# Patient Record
Sex: Male | Born: 1990 | Race: White | Hispanic: No | Marital: Married | Smoking: Current every day smoker
Health system: Southern US, Community
[De-identification: ages and names within clinical notes are randomized; demographics above are authoritative.]

## PROBLEM LIST (undated history)

## (undated) DIAGNOSIS — S0990XA Unspecified injury of head, initial encounter: Secondary | ICD-10-CM

## (undated) HISTORY — PX: FEMUR FRACTURE SURGERY: SHX633

---
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
## 2010-03-10 ENCOUNTER — Emergency Department (HOSPITAL_COMMUNITY): Admission: EM | Admit: 2010-03-10 | Discharge: 2010-03-10 | Payer: Self-pay | Admitting: Emergency Medicine

## 2012-07-06 ENCOUNTER — Encounter (HOSPITAL_COMMUNITY): Payer: Self-pay

## 2012-07-06 ENCOUNTER — Emergency Department (HOSPITAL_COMMUNITY)
Admission: EM | Admit: 2012-07-06 | Discharge: 2012-07-07 | Disposition: A | Discharge status: Home / Self Care | Payer: Self-pay | Attending: Emergency Medicine | Admitting: Emergency Medicine

## 2012-07-06 DIAGNOSIS — R45851 Suicidal ideations: Secondary | ICD-10-CM | POA: Insufficient documentation

## 2012-07-06 DIAGNOSIS — F1910 Other psychoactive substance abuse, uncomplicated: Secondary | ICD-10-CM | POA: Insufficient documentation

## 2012-07-06 LAB — CBC WITH DIFFERENTIAL/PLATELET
Basophils Absolute: 0 10*3/uL (ref 0.0–0.1)
Basophils Relative: 0 % (ref 0–1)
Eosinophils Absolute: 0 10*3/uL (ref 0.0–0.7)
Eosinophils Relative: 1 % (ref 0–5)
HCT: 43.5 % (ref 39.0–52.0)
MCHC: 36.1 g/dL — ABNORMAL HIGH (ref 30.0–36.0)
Monocytes Absolute: 0.9 10*3/uL (ref 0.1–1.0)
Monocytes Relative: 11 % (ref 3–12)
Neutro Abs: 5.4 10*3/uL (ref 1.7–7.7)
Platelets: 211 10*3/uL (ref 150–400)
RDW: 12.2 % (ref 11.5–15.5)
WBC: 8.7 10*3/uL (ref 4.0–10.5)

## 2012-07-06 LAB — BASIC METABOLIC PANEL
Calcium: 8.8 mg/dL (ref 8.4–10.5)
Creatinine, Ser: 0.96 mg/dL (ref 0.50–1.35)
GFR calc Af Amer: >90 mL/min (ref >90)

## 2012-07-06 LAB — ETHANOL: Alcohol, Ethyl (B): 124 mg/dL — ABNORMAL HIGH (ref 0–11)

## 2012-07-06 MED ORDER — ZOLPIDEM TARTRATE 5 MG PO TABS: 5.0000 mg | ORAL_TABLET | Freq: Every evening | ORAL | Status: DC | PRN

## 2012-07-06 MED ORDER — ONDANSETRON HCL 4 MG PO TABS: 4.0000 mg | ORAL_TABLET | Freq: Three times a day (TID) | ORAL | Status: DC | PRN

## 2012-07-06 MED ORDER — NICOTINE 21 MG/24HR TD PT24
21.0000 mg | MEDICATED_PATCH | Freq: Every day | TRANSDERMAL | Status: DC
  Administered 2012-07-06: 21 mg via TRANSDERMAL
  Filled 2012-07-06: qty 1

## 2012-07-06 MED ORDER — LORAZEPAM 1 MG PO TABS
1.0000 mg | ORAL_TABLET | Freq: Three times a day (TID) | ORAL | Status: DC | PRN
  Administered 2012-07-06: 1 mg via ORAL
  Filled 2012-07-06: qty 1

## 2012-07-06 MED ORDER — ACETAMINOPHEN 325 MG PO TABS: 650.0000 mg | ORAL_TABLET | ORAL | Status: DC | PRN

## 2012-07-06 MED ORDER — IBUPROFEN 600 MG PO TABS: 600.0000 mg | ORAL_TABLET | Freq: Three times a day (TID) | ORAL | Status: DC | PRN

## 2012-07-06 MED ORDER — ALUM & MAG HYDROXIDE-SIMETH 200-200-20 MG/5ML PO SUSP: 30.0000 mL | ORAL | Status: DC | PRN

## 2012-07-06 NOTE — ED Notes (Signed)
Pt sts hes had suicidial thoughts since he was 21 yrs old, his father passed this past July and hes been more depressed since then; has thoughts about shooting himself; has a plan to shoot himself for the last two weeks, sts he wants to die; law enforcement at bedside

## 2012-07-06 NOTE — ED Notes (Signed)
Pt on telephone cussing and requesting to go home, report called to psych ed wendy rn
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
## 2012-07-06 NOTE — ED Notes (Signed)
Pt. Has 2 belonging bags sitting behind [facility].
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
## 2012-07-06 NOTE — ED Notes (Signed)
Pt sitting up in bed talking with visitor.  Visitor, Field seismologist, cousin at bedside.  Pt denies SI/HI.  Pt wants to know when ACT Team will come and do assessment

## 2012-07-06 NOTE — ED Notes (Signed)
[phone] Charlotte, mother

## 2012-07-06 NOTE — ED Notes (Signed)
MD at bedside. 
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
## 2012-07-07 NOTE — ED Notes (Signed)
Pt is currently waiting for ride to come pick him up. He declined toothbrush and toothpaste.

## 2012-07-07 NOTE — ED Notes (Signed)
Pt's family arrived to pick the pt up, writer walked pt up to the discharge window and gave him back his two personal belongings bags.

## 2012-07-07 NOTE — ED Notes (Signed)
Pt reports he's ready to discharge and get some real food. He denies being a threat to himself or others. He's called for a ride. Discharge papers have been explained to pt and he indicated full understanding. Referrals were given as well.
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
## 2014-09-20 ENCOUNTER — Encounter (HOSPITAL_BASED_OUTPATIENT_CLINIC_OR_DEPARTMENT_OTHER): Payer: Self-pay | Admitting: Emergency Medicine

## 2014-09-20 ENCOUNTER — Emergency Department (HOSPITAL_BASED_OUTPATIENT_CLINIC_OR_DEPARTMENT_OTHER): Payer: Self-pay

## 2014-09-20 ENCOUNTER — Emergency Department (HOSPITAL_BASED_OUTPATIENT_CLINIC_OR_DEPARTMENT_OTHER)
Admission: EM | Admit: 2014-09-20 | Discharge: 2014-09-20 | Disposition: A | Discharge status: Home / Self Care | Payer: Self-pay | Attending: Emergency Medicine | Admitting: Emergency Medicine

## 2014-09-20 ENCOUNTER — Emergency Department (HOSPITAL_COMMUNITY): Admission: EM | Admit: 2014-09-20 | Discharge: 2014-09-20 | Discharge status: Against Medical Advice | Payer: Self-pay

## 2014-09-20 DIAGNOSIS — Z720 Tobacco use: Secondary | ICD-10-CM | POA: Insufficient documentation

## 2014-09-20 DIAGNOSIS — S29001A Unspecified injury of muscle and tendon of front wall of thorax, initial encounter: Secondary | ICD-10-CM | POA: Insufficient documentation

## 2014-09-20 DIAGNOSIS — R52 Pain, unspecified: Secondary | ICD-10-CM

## 2014-09-20 DIAGNOSIS — R079 Chest pain, unspecified: Secondary | ICD-10-CM

## 2014-09-20 DIAGNOSIS — Y998 Other external cause status: Secondary | ICD-10-CM | POA: Insufficient documentation

## 2014-09-20 DIAGNOSIS — Y9389 Activity, other specified: Secondary | ICD-10-CM | POA: Insufficient documentation

## 2014-09-20 DIAGNOSIS — S42022A Displaced fracture of shaft of left clavicle, initial encounter for closed fracture: Secondary | ICD-10-CM | POA: Insufficient documentation

## 2014-09-20 DIAGNOSIS — W228XXA Striking against or struck by other objects, initial encounter: Secondary | ICD-10-CM | POA: Insufficient documentation

## 2014-09-20 DIAGNOSIS — S42002A Fracture of unspecified part of left clavicle, initial encounter for closed fracture: Secondary | ICD-10-CM

## 2014-09-20 DIAGNOSIS — Y9289 Other specified places as the place of occurrence of the external cause: Secondary | ICD-10-CM | POA: Insufficient documentation

## 2014-09-20 MED ORDER — ONDANSETRON 4 MG PO TBDP
4.0000 mg | ORAL_TABLET | Freq: Once | ORAL | Status: AC
  Administered 2014-09-20: 4 mg via ORAL

## 2014-09-20 MED ORDER — NAPROXEN 500 MG PO TABS: 500.0000 mg | ORAL_TABLET | Freq: Two times a day (BID) | ORAL | Status: DC

## 2014-09-20 MED ORDER — HYDROMORPHONE HCL 1 MG/ML IJ SOLN
INTRAMUSCULAR | Status: AC
  Administered 2014-09-20: 2 mg
  Filled 2014-09-20: qty 2

## 2014-09-20 MED ORDER — ONDANSETRON 4 MG PO TBDP
ORAL_TABLET | ORAL | Status: AC
Start: 2014-09-20 — End: 2014-09-20
  Administered 2014-09-20: 4 mg via ORAL
  Filled 2014-09-20: qty 1

## 2014-09-20 MED ORDER — HYDROMORPHONE HCL 4 MG PO TABS: 4.0000 mg | ORAL_TABLET | Freq: Four times a day (QID) | ORAL | Status: DC | PRN

## 2014-09-20 MED ORDER — HYDROMORPHONE HCL 2 MG/ML IJ SOLN
2.0000 mg | Freq: Once | INTRAMUSCULAR | Status: DC
  Filled 2014-09-20: qty 1

## 2014-09-20 MED ORDER — PROMETHAZINE HCL 25 MG PO TABS: 25.0000 mg | ORAL_TABLET | Freq: Four times a day (QID) | ORAL | Status: DC | PRN

## 2014-09-20 NOTE — ED Notes (Signed)
[facility] at the facility to be seen.

## 2014-09-20 NOTE — ED Notes (Signed)
Pt did not answer when called for triage 

## 2014-09-20 NOTE — ED Notes (Signed)
Patient transported to X-ray 
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
## 2014-09-20 NOTE — Discharge Instructions (Signed)
Use the sling as needed for comfort. Should feel more comfortable with it on. Take the Naprosyn on a regular basis supplemented with the hydromorphone pain medicine as needed. If sports medicine on call for follow-up on Monday. Also given information on orthopedic surgical shoe list if needed.

## 2014-09-20 NOTE — ED Notes (Signed)
Call patient to triage no answer.

## 2014-09-22 ENCOUNTER — Ambulatory Visit (INDEPENDENT_AMBULATORY_CARE_PROVIDER_SITE_OTHER): Payer: Self-pay | Admitting: Family Medicine

## 2014-09-22 ENCOUNTER — Encounter: Payer: Self-pay | Admitting: Family Medicine

## 2014-09-22 VITALS — BP 129/88 | HR 80 | Ht 70.0 in | Wt 175.0 lb

## 2014-09-22 DIAGNOSIS — S42002A Fracture of unspecified part of left clavicle, initial encounter for closed fracture: Secondary | ICD-10-CM

## 2014-09-22 MED ORDER — HYDROMORPHONE HCL 4 MG PO TABS
4.0000 mg | ORAL_TABLET | Freq: Four times a day (QID) | ORAL | Status: DC | PRN
Start: 2014-09-22 — End: 2014-10-01

## 2014-09-22 MED ORDER — HYDROCODONE-ACETAMINOPHEN 5-325 MG PO TABS: 1.0000 | ORAL_TABLET | Freq: Four times a day (QID) | ORAL | Status: DC | PRN

## 2014-09-22 MED ORDER — ONDANSETRON HCL 4 MG PO TABS: 4.0000 mg | ORAL_TABLET | Freq: Three times a day (TID) | ORAL | Status: DC | PRN

## 2014-09-23 DIAGNOSIS — S42002A Fracture of unspecified part of left clavicle, initial encounter for closed fracture: Secondary | ICD-10-CM | POA: Insufficient documentation
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
## 2014-09-23 NOTE — Assessment & Plan Note (Addendum)
Left mid-clavicle fracture - significant overlap and shortening of 4.5cm.  Discussed surgery is typically required for these types of fractures to regain full motion and for the fracture fragments to be approximated, fixated and allow this to heal.  Unfortunately he does not have insurance however.  We gave him cone coverage paperwork, called to local orthopedic groups and patient will try to come up with money up front required for him to be evaluated.  Appointment was made for this afternoon with ortho but patient wanted to cancel this and discuss with his family, call us back.  Without surgery I do not think he will regain full motion, strength, and may deal with chronic pain as a result - patient made aware of these risks.  In meantime he will use sling regularly, ice, nsaids with dilaudid and anti-emetic.

## 2014-09-25 NOTE — Addendum Note (Signed)
Addended by: Kathi SimpersWISE, Makaio Mach F on: 09/25/2014 09:02 AM   Modules accepted: Orders

## 2014-09-30 ENCOUNTER — Other Ambulatory Visit (HOSPITAL_COMMUNITY): Payer: Self-pay | Admitting: Orthopaedic Surgery

## 2014-09-30 ENCOUNTER — Encounter (HOSPITAL_COMMUNITY): Payer: Self-pay | Admitting: *Deleted

## 2014-09-30 NOTE — Anesthesia Preprocedure Evaluation (Addendum)
Anesthesia Evaluation  Patient identified by MRN, date of birth, ID band Patient awake    Reviewed: Allergy & Precautions, NPO status , Patient's Chart, lab work & pertinent test results, reviewed documented beta blocker date and time   Airway Mallampati: I  TM Distance: >3 FB Neck ROM: Full    Dental  (+) Teeth Intact, Dental Advisory Given   Pulmonary Current Smoker,  breath sounds clear to auscultation        Cardiovascular negative cardio ROS  Rhythm:Regular Rate:Normal     Neuro/Psych negative neurological ROS     GI/Hepatic negative GI ROS, Neg liver ROS,   Endo/Other  negative endocrine ROS  Renal/GU      Musculoskeletal   Abdominal   Peds  Hematology negative hematology ROS (+)   Anesthesia Other Findings   Reproductive/Obstetrics                            Anesthesia Physical Anesthesia Plan  ASA: II  Anesthesia Plan: General   Post-op Pain Management:    Induction: Intravenous  Airway Management Planned: Oral ETT  Additional Equipment:   Intra-op Plan:   Post-operative Plan: Extubation in OR  Informed Consent: I have reviewed the patients History and Physical, chart, labs and discussed the procedure including the risks, benefits and alternatives for the proposed anesthesia with the patient or authorized representative who has indicated his/her understanding and acceptance.     Plan Discussed with:   Anesthesia Plan Comments: (Check am CBC)        Anesthesia Quick Evaluation
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
## 2014-10-01 ENCOUNTER — Encounter (HOSPITAL_COMMUNITY): Payer: Self-pay | Admitting: Surgery

## 2014-10-01 ENCOUNTER — Encounter (HOSPITAL_COMMUNITY)
Admission: RE | Disposition: A | Discharge status: Home / Self Care | Payer: Self-pay | Source: Ambulatory Visit | Attending: Orthopaedic Surgery

## 2014-10-01 ENCOUNTER — Ambulatory Visit (HOSPITAL_COMMUNITY): Payer: Medicaid Other

## 2014-10-01 ENCOUNTER — Ambulatory Visit (HOSPITAL_COMMUNITY)
Admission: RE | Admit: 2014-10-01 | Discharge: 2014-10-01 | Disposition: A | Discharge status: Home / Self Care | Payer: Medicaid Other | Source: Ambulatory Visit | Attending: Orthopaedic Surgery | Admitting: Orthopaedic Surgery

## 2014-10-01 ENCOUNTER — Ambulatory Visit (HOSPITAL_COMMUNITY): Payer: Medicaid Other | Admitting: Anesthesiology

## 2014-10-01 DIAGNOSIS — Y9289 Other specified places as the place of occurrence of the external cause: Secondary | ICD-10-CM | POA: Insufficient documentation

## 2014-10-01 DIAGNOSIS — S42002A Fracture of unspecified part of left clavicle, initial encounter for closed fracture: Secondary | ICD-10-CM | POA: Diagnosis present

## 2014-10-01 DIAGNOSIS — Z882 Allergy status to sulfonamides status: Secondary | ICD-10-CM | POA: Diagnosis not present

## 2014-10-01 DIAGNOSIS — Y9389 Activity, other specified: Secondary | ICD-10-CM | POA: Insufficient documentation

## 2014-10-01 DIAGNOSIS — Z888 Allergy status to other drugs, medicaments and biological substances status: Secondary | ICD-10-CM | POA: Diagnosis not present

## 2014-10-01 DIAGNOSIS — Z419 Encounter for procedure for purposes other than remedying health state, unspecified: Secondary | ICD-10-CM

## 2014-10-01 DIAGNOSIS — Y998 Other external cause status: Secondary | ICD-10-CM | POA: Insufficient documentation

## 2014-10-01 DIAGNOSIS — X58XXXA Exposure to other specified factors, initial encounter: Secondary | ICD-10-CM | POA: Insufficient documentation

## 2014-10-01 DIAGNOSIS — F17210 Nicotine dependence, cigarettes, uncomplicated: Secondary | ICD-10-CM | POA: Insufficient documentation

## 2014-10-01 DIAGNOSIS — Z885 Allergy status to narcotic agent status: Secondary | ICD-10-CM | POA: Diagnosis not present

## 2014-10-01 HISTORY — DX: Unspecified injury of head, initial encounter: S09.90XA

## 2014-10-01 HISTORY — PX: ORIF CLAVICULAR FRACTURE: SHX5055

## 2014-10-01 LAB — COMPREHENSIVE METABOLIC PANEL
ALT: 74 U/L — ABNORMAL HIGH (ref 0–53)
AST: 64 U/L — ABNORMAL HIGH (ref 0–37)
Albumin: 4.3 g/dL (ref 3.5–5.2)
Alkaline Phosphatase: 60 U/L (ref 39–117)
Anion gap: 8 (ref 5–15)
BILIRUBIN TOTAL: 0.4 mg/dL (ref 0.3–1.2)
BUN: 10 mg/dL (ref 6–23)
CO2: 29 mmol/L (ref 19–32)
Calcium: 9.6 mg/dL (ref 8.4–10.5)
Chloride: 102 mmol/L (ref 96–112)
Creatinine, Ser: 1.1 mg/dL (ref 0.50–1.35)
GFR calc Af Amer: >90 mL/min (ref >90)
Glucose, Bld: 99 mg/dL (ref 70–99)
Potassium: 4.2 mmol/L (ref 3.5–5.1)
Sodium: 139 mmol/L (ref 135–145)
Total Protein: 7.5 g/dL (ref 6.0–8.3)

## 2014-10-01 LAB — CBC
HCT: 47.7 % (ref 39.0–52.0)
Hemoglobin: 16.7 g/dL (ref 13.0–17.0)
MCH: 29.9 pg (ref 26.0–34.0)
MCHC: 35 g/dL (ref 30.0–36.0)
MCV: 85.5 fL (ref 78.0–100.0)
Platelets: 220 10*3/uL (ref 150–400)
RBC: 5.58 MIL/uL (ref 4.22–5.81)
RDW: 13 % (ref 11.5–15.5)
WBC: 11 10*3/uL — ABNORMAL HIGH (ref 4.0–10.5)

## 2014-10-01 SURGERY — OPEN REDUCTION INTERNAL FIXATION (ORIF) CLAVICULAR FRACTURE
Anesthesia: General | Laterality: Left

## 2014-10-01 MED ORDER — FENTANYL CITRATE 0.05 MG/ML IJ SOLN
INTRAMUSCULAR | Status: AC
  Filled 2014-10-01: qty 5

## 2014-10-01 MED ORDER — CEFAZOLIN SODIUM-DEXTROSE 2-3 GM-% IV SOLR
2.0000 g | INTRAVENOUS | Status: AC
  Administered 2014-10-01: 2 g via INTRAVENOUS
  Filled 2014-10-01: qty 50

## 2014-10-01 MED ORDER — LIDOCAINE HCL (CARDIAC) 20 MG/ML IV SOLN
INTRAVENOUS | Status: AC
  Filled 2014-10-01: qty 15

## 2014-10-01 MED ORDER — GLYCOPYRROLATE 0.2 MG/ML IJ SOLN
INTRAMUSCULAR | Status: DC | PRN
  Administered 2014-10-01: 0.6 mg via INTRAVENOUS

## 2014-10-01 MED ORDER — HYDROMORPHONE HCL 1 MG/ML IJ SOLN
INTRAMUSCULAR | Status: AC
  Administered 2014-10-01: 0.5 mg via INTRAVENOUS
  Filled 2014-10-01: qty 1

## 2014-10-01 MED ORDER — BUPIVACAINE HCL (PF) 0.5 % IJ SOLN
INTRAMUSCULAR | Status: AC
  Filled 2014-10-01: qty 10

## 2014-10-01 MED ORDER — BUPIVACAINE HCL (PF) 0.5 % IJ SOLN
INTRAMUSCULAR | Status: DC | PRN
  Administered 2014-10-01: 10 mL

## 2014-10-01 MED ORDER — ONDANSETRON HCL 4 MG/2ML IJ SOLN
INTRAMUSCULAR | Status: DC | PRN
  Administered 2014-10-01: 4 mg via INTRAVENOUS

## 2014-10-01 MED ORDER — FENTANYL CITRATE 0.05 MG/ML IJ SOLN
INTRAMUSCULAR | Status: DC | PRN
  Administered 2014-10-01: 150 ug via INTRAVENOUS
  Administered 2014-10-01: 100 ug via INTRAVENOUS
  Administered 2014-10-01: 50 ug via INTRAVENOUS
  Administered 2014-10-01 (×2): 100 ug via INTRAVENOUS

## 2014-10-01 MED ORDER — FENTANYL CITRATE 0.05 MG/ML IJ SOLN: 25.0000 ug | INTRAMUSCULAR | Status: DC | PRN

## 2014-10-01 MED ORDER — PROMETHAZINE HCL 25 MG/ML IJ SOLN: 6.2500 mg | INTRAMUSCULAR | Status: DC | PRN

## 2014-10-01 MED ORDER — METHOCARBAMOL 500 MG PO TABS: 500.0000 mg | ORAL_TABLET | Freq: Four times a day (QID) | ORAL | Status: DC | PRN

## 2014-10-01 MED ORDER — DEXAMETHASONE SODIUM PHOSPHATE 10 MG/ML IJ SOLN
INTRAMUSCULAR | Status: DC | PRN
  Administered 2014-10-01: 10 mg via INTRAVENOUS

## 2014-10-01 MED ORDER — HYDROMORPHONE HCL 1 MG/ML IJ SOLN
0.5000 mg | INTRAMUSCULAR | Status: DC | PRN
  Administered 2014-10-01 (×2): 0.5 mg via INTRAVENOUS

## 2014-10-01 MED ORDER — BUPIVACAINE LIPOSOME 1.3 % IJ SUSP
8.0000 mL | Freq: Once | INTRAMUSCULAR | Status: AC
  Administered 2014-10-01: 20 mL
  Filled 2014-10-01: qty 20

## 2014-10-01 MED ORDER — DEXAMETHASONE SODIUM PHOSPHATE 10 MG/ML IJ SOLN
INTRAMUSCULAR | Status: AC
  Filled 2014-10-01: qty 1

## 2014-10-01 MED ORDER — MIDAZOLAM HCL 2 MG/2ML IJ SOLN
INTRAMUSCULAR | Status: AC
  Filled 2014-10-01: qty 2

## 2014-10-01 MED ORDER — NEOSTIGMINE METHYLSULFATE 10 MG/10ML IV SOLN
INTRAVENOUS | Status: DC | PRN
  Administered 2014-10-01: 5 mg via INTRAVENOUS

## 2014-10-01 MED ORDER — LIDOCAINE HCL (CARDIAC) 20 MG/ML IV SOLN
INTRAVENOUS | Status: DC | PRN
  Administered 2014-10-01: 50 mg via INTRAVENOUS
  Administered 2014-10-01: 100 mg via INTRAVENOUS

## 2014-10-01 MED ORDER — LIDOCAINE HCL (CARDIAC) 20 MG/ML IV SOLN
INTRAVENOUS | Status: AC
  Filled 2014-10-01: qty 5

## 2014-10-01 MED ORDER — PROPOFOL 10 MG/ML IV BOLUS
INTRAVENOUS | Status: AC
  Filled 2014-10-01: qty 20

## 2014-10-01 MED ORDER — FENTANYL CITRATE 0.05 MG/ML IJ SOLN
100.0000 ug | Freq: Once | INTRAMUSCULAR | Status: AC
  Administered 2014-10-01: 100 ug via INTRAVENOUS

## 2014-10-01 MED ORDER — HYDROMORPHONE HCL 2 MG PO TABS: 2.0000 mg | ORAL_TABLET | ORAL | Status: DC | PRN

## 2014-10-01 MED ORDER — ROCURONIUM BROMIDE 100 MG/10ML IV SOLN
INTRAVENOUS | Status: DC | PRN
  Administered 2014-10-01: 40 mg via INTRAVENOUS
  Administered 2014-10-01: 10 mg via INTRAVENOUS

## 2014-10-01 MED ORDER — MEPERIDINE HCL 25 MG/ML IJ SOLN: 6.2500 mg | INTRAMUSCULAR | Status: DC | PRN

## 2014-10-01 MED ORDER — FENTANYL CITRATE 0.05 MG/ML IJ SOLN
INTRAMUSCULAR | Status: AC
  Administered 2014-10-01: 100 ug via INTRAVENOUS
  Filled 2014-10-01: qty 2

## 2014-10-01 MED ORDER — PROPOFOL 10 MG/ML IV BOLUS
INTRAVENOUS | Status: DC | PRN
  Administered 2014-10-01: 200 mg via INTRAVENOUS

## 2014-10-01 MED ORDER — LACTATED RINGERS IV SOLN
INTRAVENOUS | Status: DC
  Administered 2014-10-01 (×2): via INTRAVENOUS

## 2014-10-01 MED ORDER — 0.9 % SODIUM CHLORIDE (POUR BTL) OPTIME
TOPICAL | Status: DC | PRN
  Administered 2014-10-01: 1000 mL

## 2014-10-01 MED ORDER — MIDAZOLAM HCL 5 MG/5ML IJ SOLN
INTRAMUSCULAR | Status: DC | PRN
  Administered 2014-10-01: 2 mg via INTRAVENOUS

## 2014-10-01 SURGICAL SUPPLY — 62 items
APL SKNCLS STERI-STRIP NONHPOA (GAUZE/BANDAGES/DRESSINGS) ×1
BENZOIN TINCTURE PRP APPL 2/3 (GAUZE/BANDAGES/DRESSINGS) ×2 IMPLANT
BIT DRILL 2.5X2.75 QC CALB (BIT) ×2 IMPLANT
BIT DRILL CALIBRATED 2.7 (BIT) ×1 IMPLANT
BIT DRILL CALIBRATED 2.7MM (BIT) ×1
CHLORAPREP W/TINT 26ML (MISCELLANEOUS) ×2 IMPLANT
CLOSURE WOUND 1/2 X4 (GAUZE/BANDAGES/DRESSINGS) ×1
COVER SURGICAL LIGHT HANDLE (MISCELLANEOUS) ×3 IMPLANT
DRAPE C-ARM 42X72 X-RAY (DRAPES) ×3 IMPLANT
DRAPE IMP U-DRAPE 54X76 (DRAPES) ×3 IMPLANT
DRAPE INCISE 23X17 IOBAN STRL (DRAPES) ×2
DRAPE INCISE 23X17 STRL (DRAPES) IMPLANT
DRAPE INCISE IOBAN 23X17 STRL (DRAPES) ×1 IMPLANT
DRAPE U-SHAPE 47X51 STRL (DRAPES) ×3 IMPLANT
DRSG EMULSION OIL 3X3 NADH (GAUZE/BANDAGES/DRESSINGS) ×3 IMPLANT
ELECT REM PT RETURN 9FT ADLT (ELECTROSURGICAL) ×3
ELECTRODE REM PT RTRN 9FT ADLT (ELECTROSURGICAL) ×1 IMPLANT
GAUZE SPONGE 4X4 12PLY STRL (GAUZE/BANDAGES/DRESSINGS) ×3 IMPLANT
GLOVE BIO SURGEON STRL SZ7 (GLOVE) ×2 IMPLANT
GLOVE BIOGEL PI IND STRL 7.0 (GLOVE) IMPLANT
GLOVE BIOGEL PI IND STRL 8 (GLOVE) ×1 IMPLANT
GLOVE BIOGEL PI INDICATOR 7.0 (GLOVE) ×4
GLOVE BIOGEL PI INDICATOR 8 (GLOVE) ×4
GLOVE BIOGEL PI ORTHO PRO 7.5 (GLOVE) ×2
GLOVE ORTHO TXT STRL SZ7.5 (GLOVE) ×3 IMPLANT
GLOVE PI ORTHO PRO STRL 7.5 (GLOVE) IMPLANT
GOWN STRL REUS W/ TWL LRG LVL3 (GOWN DISPOSABLE) ×1 IMPLANT
GOWN STRL REUS W/TWL 2XL LVL3 (GOWN DISPOSABLE) ×2 IMPLANT
GOWN STRL REUS W/TWL LRG LVL3 (GOWN DISPOSABLE) ×3
KIT BASIN OR (CUSTOM PROCEDURE TRAY) ×3 IMPLANT
KIT ROOM TURNOVER OR (KITS) ×3 IMPLANT
MANIFOLD NEPTUNE II (INSTRUMENTS) ×3 IMPLANT
NDL HYPO 25GX1X1/2 BEV (NEEDLE) IMPLANT
NEEDLE HYPO 25GX1X1/2 BEV (NEEDLE) ×3 IMPLANT
NS IRRIG 1000ML POUR BTL (IV SOLUTION) ×3 IMPLANT
PACK SHOULDER (CUSTOM PROCEDURE TRAY) ×3 IMPLANT
PACK UNIVERSAL I (CUSTOM PROCEDURE TRAY) ×3 IMPLANT
PAD ARMBOARD 7.5X6 YLW CONV (MISCELLANEOUS) ×6 IMPLANT
PLATE FIBULAR COMP LOCK 10H (Plate) ×2 IMPLANT
SCREW CORT T15 24X3.5XST LCK (Screw) IMPLANT
SCREW CORTICAL 3.5X24MM (Screw) ×3 IMPLANT
SCREW CORTICAL LOW PROF 3.5X20 (Screw) ×4 IMPLANT
SCREW LOCK 3.5X22 DIST TIB (Screw) ×2 IMPLANT
SCREW LOCK CORT STAR 3.5X12 (Screw) ×2 IMPLANT
SCREW LOCK CORT STAR 3.5X16 (Screw) ×4 IMPLANT
SCREW LOW PROFILE 18MMX3.5MM (Screw) ×2 IMPLANT
SCREW NON LOCKING LP 3.5 16MM (Screw) ×4 IMPLANT
SLING ARM IMMOBILIZER LRG (SOFTGOODS) ×2 IMPLANT
SPONGE LAP 18X18 X RAY DECT (DISPOSABLE) ×6 IMPLANT
STRIP CLOSURE SKIN 1/2X4 (GAUZE/BANDAGES/DRESSINGS) ×2 IMPLANT
SUCTION FRAZIER TIP 10 FR DISP (SUCTIONS) ×3 IMPLANT
SUT PROLENE 3 0 PS 1 (SUTURE) ×3 IMPLANT
SUT VIC AB 2-0 CT1 27 (SUTURE) ×3
SUT VIC AB 2-0 CT1 TAPERPNT 27 (SUTURE) ×1 IMPLANT
SUT VIC AB 3-0 SH 27 (SUTURE) ×3
SUT VIC AB 3-0 SH 27X BRD (SUTURE) IMPLANT
SUT VICRYL 0 CT 1 36IN (SUTURE) ×3 IMPLANT
SYR CONTROL 10ML LL (SYRINGE) ×2 IMPLANT
TAPE CLOTH SURG 6X10 WHT LF (GAUZE/BANDAGES/DRESSINGS) ×2 IMPLANT
WATER STERILE IRR 1000ML POUR (IV SOLUTION) ×3 IMPLANT
WIRE K 1.6MM 144256 (MISCELLANEOUS) ×4 IMPLANT
YANKAUER SUCT BULB TIP NO VENT (SUCTIONS) ×3 IMPLANT

## 2014-10-01 NOTE — Discharge Instructions (Signed)

## 2014-10-01 NOTE — Progress Notes (Signed)
Pt stated feeling much better.

## 2014-10-01 NOTE — Interval H&P Note (Signed)
History and Physical Interval Note:  10/01/2014 2:31 PM  Jason Sims  has presented today for surgery, with the diagnosis of Left Comminuted Clavicle Fracture  The various methods of treatment have been discussed with the patient and family. After consideration of risks, benefits and other options for treatment, the patient has consented to  Procedure(s): OPEN REDUCTION INTERNAL FIXATION (ORIF) CLAVICULAR FRACTURE (Left) as a surgical intervention .  The patient's history has been reviewed, patient examined, no change in status, stable for surgery.  I have reviewed the patient's chart and labs.  Questions were answered to the patient's satisfaction.     Chan Rosasco C

## 2014-10-01 NOTE — Transfer of Care (Signed)
Immediate Anesthesia Transfer of Care Note  Patient: Jason Sims  Procedure(s) Performed: Procedure(s): OPEN REDUCTION INTERNAL FIXATION (ORIF) CLAVICULAR FRACTURE (Left)  Patient Location: PACU  Anesthesia Type:General  Level of Consciousness: awake, oriented, sedated, patient cooperative and responds to stimulation  Airway & Oxygen Therapy: Patient Spontanous Breathing and Patient connected to nasal cannula oxygen  Post-op Assessment: Report given to PACU RN, Post -op Vital signs reviewed and stable, Patient moving all extremities and Patient moving all extremities X 4  Post vital signs: Reviewed and stable  Complications: No apparent anesthesia complications

## 2014-10-01 NOTE — Anesthesia Procedure Notes (Addendum)
Procedure Name: Intubation Date/Time: 10/01/2014 3:50 PM Performed by: Elon AlasLEE, HEATHER BROWN Pre-anesthesia Checklist: Patient identified, Timeout performed, Emergency Drugs available, Suction available and Patient being monitored Patient Re-evaluated:Patient Re-evaluated prior to inductionOxygen Delivery Method: Circle system utilized Preoxygenation: Pre-oxygenation with 100% oxygen Intubation Type: IV induction Ventilation: Mask ventilation without difficulty Laryngoscope Size: Mac and 4 Grade View: Grade I Tube type: Oral Tube size: 7.5 mm Number of attempts: 1 Airway Equipment and Method: Stylet Placement Confirmation: CO2 detector,  positive ETCO2,  breath sounds checked- equal and bilateral and ETT inserted through vocal cords under direct vision Secured at: 23 cm Tube secured with: Tape Dental Injury: Teeth and Oropharynx as per pre-operative assessment

## 2014-10-01 NOTE — Brief Op Note (Signed)
10/01/2014  5:19 PM  PATIENT:  Jason Sims  24 y.o. male  PRE-OPERATIVE DIAGNOSIS:  Left Comminuted Clavicle Fracture  POST-OPERATIVE DIAGNOSIS:  Left Comminuted Clavicle Fracture  PROCEDURE:  Procedure(s): OPEN REDUCTION INTERNAL FIXATION (ORIF) CLAVICULAR FRACTURE (Left)  SURGEON:  Surgeon(s) and Role:    * Eldred MangesMark C Yates, MD - Primary  PHYSICIAN ASSISTANT: Zonia Kiefjames owens pa-c    ANESTHESIA:   general  EBL:  Total I/O In: 1000 [I.V.:1000] Out: -   BLOOD ADMINISTERED:none  DRAINS: none   SPECIMEN:  No Specimen  DISPOSITION OF SPECIMEN:  N/A  COUNTS:  YES  TOURNIQUET:  * No tourniquets in log *

## 2014-10-02 ENCOUNTER — Encounter (HOSPITAL_COMMUNITY): Payer: Self-pay | Admitting: Orthopaedic Surgery

## 2014-10-02 NOTE — Anesthesia Postprocedure Evaluation (Signed)
  Anesthesia Post-op Note  Patient: Jason Sims  Procedure(s) Performed: Procedure(s): OPEN REDUCTION INTERNAL FIXATION (ORIF) CLAVICULAR FRACTURE (Left)  Patient Location: PACU  Anesthesia Type:General  Level of Consciousness: awake and alert   Airway and Oxygen Therapy: Patient Spontanous Breathing  Post-op Pain: none  Post-op Assessment: Post-op Vital signs reviewed  Post-op Vital Signs: Reviewed  Last Vitals:  Filed Vitals:   10/01/14 1817  BP: 126/56  Pulse: 70  Temp: 36.7 C  Resp: 17    Complications: No apparent anesthesia complications

## 2014-10-02 NOTE — Op Note (Signed)
NAMValda Sims:  [name], [name]               ACCOUNT NO.:  [id number]  MEDICAL RECORD NO.:  [id number]  LOCATION:  [facility]                         FACILITY:  [facility]  PHYSICIAN:  Mark C. Ophelia CharterYates, M.D.    DATE OF BIRTH:  [birth year]  DATE OF PROCEDURE:  10/01/2014 DATE OF DISCHARGE:  10/01/2014                              OPERATIVE REPORT   PREOPERATIVE DIAGNOSIS:  Left comminuted displaced clavicle fracture.  POSTOPERATIVE DIAGNOSIS:  Left comminuted displaced clavicle fracture.  PROCEDURE:  Open reduction and internal fixation, left clavicle.  SURGEON:  Mark C. Ophelia CharterYates, M.D.  Assistant:   Zonia KiefJames Owens PA-C medically necessary and present for the entire procedure.  ANESTHESIA:  General plus Exparel.  ESTIMATED BLOOD LOSS:  Minimal.  IMPLANTS:  Biomet composite titanium plate with 9 screws.  DESCRIPTION OF PROCEDURE:  After induction of general anesthesia, the patient was placed in a beach-chair position.  Ancef was given prophylactically.  Time-out procedure completed after prepping and draping.  Sterile skin marker was used.  Clear Steri-Drape was applied due to the patient's problems with Betadine and rash.  Incision was made following the clavicle.  Clavicle was identified proximal or medial and also lateral, then followed to the midline where there was comminution of the fracture, butterfly fragments.  Butterfly portions were repaired to the distal, fragment held with a K-wire and then medial and large lateral fragments were reduced, held with self retaining clamp overlying the Biomet composite plate which have been custom bent distally with a sharp end to fit the anterior distal surface.  Plate was placed on the anterior aspect of the clavicle, custom bent; combination of locking and nonlocking screws were reused to suck the plate down securely to the bone like the butterfly fragments and compress to plate.  Locking screws were placed intermittently once plate was down securely with the  bone were indicated.  The entire area was irrigated.  Two Fluorospot pictures were taken confirming reduction in good position and good screw length. Exparel was placed at the beginning of the case, it was diluted down with 10 mL of Marcaine, total of 30 mL and injected in the skin, subcutaneous tissue around the muscle.  Subcutaneous tissue after closure of the muscle with 0 Vicryl, 2-0 Vicryl and subcutaneous tissue, 3-0 Vicryl subcuticular closure.  Tincture of benzoin, Steri-Strips, 4x4's tape, and shoulder immobilizer; office followup in one week.  The patient will be discharge home and was not admitted.     Mark C. Ophelia CharterYates, M.D.     [facility]/MEDQ  D:  10/01/2014  T:  10/02/2014  Job:  [id number]

## 2015-10-09 ENCOUNTER — Encounter: Payer: Self-pay | Admitting: Internal Medicine

## 2015-10-09 ENCOUNTER — Other Ambulatory Visit: Payer: Self-pay | Admitting: Internal Medicine

## 2015-10-09 ENCOUNTER — Ambulatory Visit (INDEPENDENT_AMBULATORY_CARE_PROVIDER_SITE_OTHER): Payer: Managed Care, Other (non HMO) | Admitting: Internal Medicine

## 2015-10-09 VITALS — BP 124/80 | HR 68 | Temp 98.2°F | Resp 18 | Ht 70.5 in | Wt 190.0 lb

## 2015-10-09 DIAGNOSIS — L259 Unspecified contact dermatitis, unspecified cause: Secondary | ICD-10-CM | POA: Diagnosis not present

## 2015-10-09 MED ORDER — CLOTRIMAZOLE-BETAMETHASONE 1-0.05 % EX CREA: TOPICAL_CREAM | CUTANEOUS | Status: AC

## 2015-10-09 NOTE — Progress Notes (Signed)
   Subjective:    Patient ID: Jason Sims, male    DOB: 20-Aug-1991, 25 y.o.   MRN: [id number]  Rash Pertinent negatives include no cough, diarrhea, fatigue, fever or vomiting.  Patient presents to the office for evaluation of a rash on the left ring finger which has been there for several months.  He reports that it has been getting a little bit better with some hydrocortisone but it always seems to stay there.  Rash is not itchy when he uses cortisone.  He does report that it does get dry and starts bothering him.  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  He has not tried anything else.  Heat does tend to bother him.      Review of Systems  Constitutional: Negative for fever, chills and fatigue.  Respiratory: Negative for cough, chest tightness and wheezing.   Cardiovascular: Negative for chest pain and palpitations.  Gastrointestinal: Negative for nausea, vomiting, abdominal pain, diarrhea and constipation.  Genitourinary: Negative for dysuria, urgency, frequency and hematuria.  Musculoskeletal: Negative for myalgias, arthralgias, gait problem and neck stiffness.  Skin: Positive for rash. Negative for color change, pallor and wound.  Neurological: Negative for dizziness, speech difficulty and light-headedness.       Objective:   Physical Exam  Constitutional: He is oriented to person, place, and time. He appears well-developed and well-nourished. No distress.  HENT:  Head: Normocephalic.  Mouth/Throat: Oropharynx is clear and moist. No oropharyngeal exudate.  Eyes: Conjunctivae are normal. No scleral icterus.  Neck: Normal range of motion. Neck supple. No JVD present. No thyromegaly present.  Cardiovascular: Normal rate, regular rhythm, normal heart sounds and intact distal pulses.  Exam reveals no gallop and no friction rub.   No murmur heard. Pulmonary/Chest: Effort normal and breath sounds normal. No respiratory distress. He has no wheezes. He has no rales. He exhibits  no tenderness.  Musculoskeletal: Normal range of motion.  Lymphadenopathy:    He has no cervical adenopathy.  Neurological: He is alert and oriented to person, place, and time.  Skin: Skin is warm and dry. He is not diaphoretic.  Psychiatric: He has a normal mood and affect. His behavior is normal. Judgment and thought content normal.  Nursing note and vitals reviewed.   Filed Vitals:   10/09/15 0841  BP: 124/80  Pulse: 68  Temp: 98.2 F (36.8 C)  Resp: 18         Assessment & Plan:    1. Contact dermatitis -recheck in 1 month for full new patient CPE where we will go over all the history and talk about other issues which he would like to address today but given time constraints is no appropriate.   - clotrimazole-betamethasone (LOTRISONE) cream; Apply to affected area 2 times daily  Dispense: 15 g; Refill: 1

## 2015-10-09 NOTE — Patient Instructions (Signed)
Contact Dermatitis Dermatitis is redness, soreness, and swelling (inflammation) of the skin. Contact dermatitis is a reaction to certain substances that touch the skin. There are two types of contact dermatitis:   Irritant contact dermatitis. This type is caused by something that irritates your skin, such as dry hands from washing them too much. This type does not require previous exposure to the substance for a reaction to occur. This type is more common.  Allergic contact dermatitis. This type is caused by a substance that you are allergic to, such as a nickel allergy or poison ivy. This type only occurs if you have been exposed to the substance (allergen) before. Upon a repeat exposure, your body reacts to the substance. This type is less common. CAUSES  Many different substances can cause contact dermatitis. Irritant contact dermatitis is most commonly caused by exposure to:   Makeup.   Soaps.   Detergents.   Bleaches.   Acids.   Metal salts, such as nickel.  Allergic contact dermatitis is most commonly caused by exposure to:   Poisonous plants.   Chemicals.   Jewelry.   Latex.   Medicines.   Preservatives in products, such as clothing.  RISK FACTORS This condition is more likely to develop in:   People who have jobs that expose them to irritants or allergens.  People who have certain medical conditions, such as asthma or eczema.  SYMPTOMS  Symptoms of this condition may occur anywhere on your body where the irritant has touched you or is touched by you. Symptoms include:  Dryness or flaking.   Redness.   Cracks.   Itching.   Pain or a burning feeling.   Blisters.  Drainage of small amounts of blood or clear fluid from skin cracks. With allergic contact dermatitis, there may also be swelling in areas such as the eyelids, mouth, or genitals.  DIAGNOSIS  This condition is diagnosed with a medical history and physical exam. A patch skin test  may be performed to help determine the cause. If the condition is related to your job, you may need to see an occupational medicine specialist. TREATMENT Treatment for this condition includes figuring out what caused the reaction and protecting your skin from further contact. Treatment may also include:   Steroid creams or ointments. Oral steroid medicines may be needed in more severe cases.  Antibiotics or antibacterial ointments, if a skin infection is present.  Antihistamine lotion or an antihistamine taken by mouth to ease itching.  A bandage (dressing). HOME CARE INSTRUCTIONS Skin Care  Moisturize your skin as needed.   Apply cool compresses to the affected areas.  Try taking a bath with:  Epsom salts. Follow the instructions on the packaging. You can get these at your local pharmacy or grocery store.  Baking soda. Pour a small amount into the bath as directed by your health care provider.  Colloidal oatmeal. Follow the instructions on the packaging. You can get this at your local pharmacy or grocery store.  Try applying baking soda paste to your skin. Stir water into baking soda until it reaches a paste-like consistency.  Do not scratch your skin.  Bathe less frequently, such as every other day.  Bathe in lukewarm water. Avoid using hot water. Medicines  Take or apply over-the-counter and prescription medicines only as told by your health care provider.   If you were prescribed an antibiotic medicine, take or apply your antibiotic as told by your health care provider. Do not stop using the   antibiotic even if your condition starts to improve. General Instructions  Keep all follow-up visits as told by your health care provider. This is important.  Avoid the substance that caused your reaction. If you do not know what caused it, keep a journal to try to track what caused it. Write down:  What you eat.  What cosmetic products you use.  What you drink.  What  you wear in the affected area. This includes jewelry.  If you were given a dressing, take care of it as told by your health care provider. This includes when to change and remove it. SEEK MEDICAL CARE IF:   Your condition does not improve with treatment.  Your condition gets worse.  You have signs of infection such as swelling, tenderness, redness, soreness, or warmth in the affected area.  You have a fever.  You have new symptoms. SEEK IMMEDIATE MEDICAL CARE IF:   You have a severe headache, neck pain, or neck stiffness.  You vomit.  You feel very sleepy.  You notice red streaks coming from the affected area.  Your bone or joint underneath the affected area becomes painful after the skin has healed.  The affected area turns darker.  You have difficulty breathing.   This information is not intended to replace advice given to you by your health care provider. Make sure you discuss any questions you have with your health care provider.   Document Released: 08/19/2000 Document Revised: 05/13/2015 Document Reviewed: 01/07/2015 Elsevier Interactive Patient Education 2016 [employer]  

## 2015-11-13 ENCOUNTER — Ambulatory Visit (INDEPENDENT_AMBULATORY_CARE_PROVIDER_SITE_OTHER): Payer: Managed Care, Other (non HMO) | Admitting: Internal Medicine

## 2015-11-13 ENCOUNTER — Encounter: Payer: Self-pay | Admitting: Internal Medicine

## 2015-11-13 VITALS — BP 138/80 | HR 74 | Temp 98.4°F | Resp 18 | Ht 70.0 in | Wt 189.0 lb

## 2015-11-13 DIAGNOSIS — Z0000 Encounter for general adult medical examination without abnormal findings: Secondary | ICD-10-CM

## 2015-11-13 DIAGNOSIS — Z13220 Encounter for screening for lipoid disorders: Secondary | ICD-10-CM

## 2015-11-13 DIAGNOSIS — B079 Viral wart, unspecified: Secondary | ICD-10-CM | POA: Diagnosis not present

## 2015-11-13 DIAGNOSIS — Z131 Encounter for screening for diabetes mellitus: Secondary | ICD-10-CM

## 2015-11-13 DIAGNOSIS — Z130 Encounter for screening for diseases of the blood and blood-forming organs and certain disorders involving the immune mechanism: Secondary | ICD-10-CM

## 2015-11-13 DIAGNOSIS — E559 Vitamin D deficiency, unspecified: Secondary | ICD-10-CM

## 2015-11-13 DIAGNOSIS — Z1389 Encounter for screening for other disorder: Secondary | ICD-10-CM

## 2015-11-13 DIAGNOSIS — Z1329 Encounter for screening for other suspected endocrine disorder: Secondary | ICD-10-CM

## 2015-11-13 DIAGNOSIS — Z79899 Other long term (current) drug therapy: Secondary | ICD-10-CM | POA: Diagnosis not present

## 2015-11-13 LAB — BASIC METABOLIC PANEL WITH GFR
BUN: 12 mg/dL (ref 7–25)
CHLORIDE: 103 mmol/L (ref 98–110)
CO2: 26 mmol/L (ref 20–31)
CREATININE: 1.05 mg/dL (ref 0.60–1.35)
Calcium: 9.4 mg/dL (ref 8.6–10.3)
GFR, Est African American: >89 mL/min (ref >=60)
GFR, Est Non African American: >89 mL/min (ref >=60)
Glucose, Bld: 65 mg/dL (ref 65–99)
Potassium: 4.1 mmol/L (ref 3.5–5.3)
Sodium: 140 mmol/L (ref 135–146)

## 2015-11-13 LAB — MAGNESIUM: Magnesium: 2.2 mg/dL (ref 1.5–2.5)

## 2015-11-13 LAB — TSH: TSH: 1.12 m[IU]/L (ref 0.40–4.50)

## 2015-11-13 LAB — CBC WITH DIFFERENTIAL/PLATELET
BASOS ABS: 0 10*3/uL (ref 0.0–0.1)
Basophils Relative: 0 % (ref 0–1)
EOS ABS: 0.1 10*3/uL (ref 0.0–0.7)
Eosinophils Relative: 1 % (ref 0–5)
HCT: 47.1 % (ref 39.0–52.0)
Hemoglobin: 15.9 g/dL (ref 13.0–17.0)
Lymphocytes Relative: 26 % (ref 12–46)
Lymphs Abs: 2.2 10*3/uL (ref 0.7–4.0)
MCH: 29.3 pg (ref 26.0–34.0)
MCHC: 33.8 g/dL (ref 30.0–36.0)
MCV: 86.7 fL (ref 78.0–100.0)
MONOS PCT: 11 % (ref 3–12)
MPV: 9.7 fL (ref 8.6–12.4)
Monocytes Absolute: 0.9 10*3/uL (ref 0.1–1.0)
NEUTROS PCT: 62 % (ref 43–77)
Neutro Abs: 5.3 10*3/uL (ref 1.7–7.7)
PLATELETS: 273 10*3/uL (ref 150–400)
RBC: 5.43 MIL/uL (ref 4.22–5.81)
RDW: 13.2 % (ref 11.5–15.5)
WBC: 8.5 10*3/uL (ref 4.0–10.5)

## 2015-11-13 LAB — VITAMIN B12: Vitamin B-12: 460 pg/mL (ref 200–1100)

## 2015-11-13 LAB — HEPATIC FUNCTION PANEL
ALBUMIN: 4.5 g/dL (ref 3.6–5.1)
ALT: 18 U/L (ref 9–46)
AST: 22 U/L (ref 10–40)
Alkaline Phosphatase: 67 U/L (ref 40–115)
BILIRUBIN TOTAL: 0.7 mg/dL (ref 0.2–1.2)
Bilirubin, Direct: 0.2 mg/dL (ref <=0.2)
Indirect Bilirubin: 0.5 mg/dL (ref 0.2–1.2)
Total Protein: 7.2 g/dL (ref 6.1–8.1)

## 2015-11-13 LAB — LIPID PANEL
Cholesterol: 153 mg/dL (ref 125–200)
HDL: 59 mg/dL (ref >=40)
LDL Cholesterol: 80 mg/dL (ref <130)
Total CHOL/HDL Ratio: 2.6 Ratio (ref <=5.0)
Triglycerides: 68 mg/dL (ref <150)
VLDL: 14 mg/dL (ref <30)

## 2015-11-13 LAB — IRON AND TIBC
%SAT: 33 % (ref 15–60)
IRON: 109 ug/dL (ref 50–195)
TIBC: 335 ug/dL (ref 250–425)
UIBC: 226 ug/dL (ref 125–400)

## 2015-11-13 NOTE — Patient Instructions (Signed)
Food Choices for Gastroesophageal Reflux Disease, Adult When you have gastroesophageal reflux disease (GERD), the foods you eat and your eating habits are very important. Choosing the right foods can help ease the discomfort of GERD. WHAT GENERAL GUIDELINES DO I NEED TO FOLLOW?  Choose fruits, vegetables, whole grains, low-fat dairy products, and low-fat meat, fish, and poultry.  Limit fats such as oils, salad dressings, butter, nuts, and avocado.  Keep a food diary to identify foods that cause symptoms.  Avoid foods that cause reflux. These may be different for different people.  Eat frequent small meals instead of three large meals each day.  Eat your meals slowly, in a relaxed setting.  Limit fried foods.  Cook foods using methods other than frying.  Avoid drinking alcohol.  Avoid drinking large amounts of liquids with your meals.  Avoid bending over or lying down until 2-3 hours after eating. WHAT FOODS ARE NOT RECOMMENDED? The following are some foods and drinks that may worsen your symptoms: Vegetables Tomatoes. Tomato juice. Tomato and spaghetti sauce. Chili peppers. Onion and garlic. Horseradish. Fruits Oranges, grapefruit, and lemon (fruit and juice). Meats High-fat meats, fish, and poultry. This includes hot dogs, ribs, ham, sausage, salami, and bacon. Dairy Whole milk and chocolate milk. Sour cream. Cream. Butter. Ice cream. Cream cheese.  Beverages Coffee and tea, with or without caffeine. Carbonated beverages or energy drinks. Condiments Hot sauce. Barbecue sauce.  Sweets/Desserts Chocolate and cocoa. Donuts. Peppermint and spearmint. Fats and Oils High-fat foods, including French fries and potato chips. Other Vinegar. Strong spices, such as black pepper, white pepper, red pepper, cayenne, curry powder, cloves, ginger, and chili powder. The items listed above may not be a complete list of foods and beverages to avoid. Contact your dietitian for more  information.   This information is not intended to replace advice given to you by your health care provider. Make sure you discuss any questions you have with your health care provider.   Document Released: 08/22/2005 Document Revised: 09/12/2014 Document Reviewed: 06/26/2013 Elsevier Interactive Patient Education 2016 [employer]  

## 2015-11-13 NOTE — Progress Notes (Signed)
Annual Screening Comprehensive Examination   This very nice 24 y.o.male presents for complete physical.  Patient has no major health issues.  Patient reports no complaints at this time.   Patient reports that he is doing well currently.  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  He also does a lot of yardwork.  They do live on [place] acres.    He does still have a rash on the left ring finger which is better from the last visit, but still present.    Finally, patient has history of Vitamin D Deficiency and last vitamin D was No results found for: VD25OH.  Currently on supplementation  He does not currently see an eye doctor.  Last visit was at least 2-3 years ago.    He does see a Education officer, communitydentist.  He sees Dr. Lysbeth PennerMango and Jaynie CrumbleLangdon.  He goes at least 2-3 times per year.    He does have several warts on his hand.  He reports they have been there several months and they are painful and bleed.  He would like to have them removed.     Current Outpatient Prescriptions on File Prior to Visit  Medication Sig Dispense Refill  . clotrimazole-betamethasone (LOTRISONE) cream Apply to affected area 2 times daily 15 g 1   No current facility-administered medications on file prior to visit.    Allergies  Allergen Reactions  . Codeine Anaphylaxis    tolerates Dilaudid and Fentanyl  . Sulfa Antibiotics Other (See Comments)    Unknown  . Betadine [Povidone Iodine] Rash  . Other Rash    Metals causes rash    Past Medical History  Diagnosis Date  . Head injury, closed, without LOC     concussion     There is no immunization history on file for this patient.  Past Surgical History  Procedure Laterality Date  . Femur fracture surgery Left     pinning   . Orif clavicular fracture Left 10/01/2014    Procedure: OPEN REDUCTION INTERNAL FIXATION (ORIF) CLAVICULAR FRACTURE;  Surgeon: Eldred MangesMark C Yates, MD;  Location: [facility] OR;  Service: Orthopedics;   Laterality: Left;    No family history on file.  Social History   Social History  . Marital Status: Married    Spouse Name: N/A  . Number of Children: N/A  . Years of Education: N/A   Occupational History  . Not on file.   Social History Main Topics  . Smoking status: Current Every Day Smoker -- 0.50 packs/day for 10 years    Types: Cigarettes  . Smokeless tobacco: Not on file  . Alcohol Use: No  . Drug Use: No  . Sexual Activity: Not on file   Other Topics Concern  . Not on file   Social History Narrative   Review of Systems  Constitutional: Negative for fever, chills and malaise/fatigue.  HENT: Negative for congestion, ear pain and sore throat.   Eyes: Negative.   Respiratory: Negative for cough, shortness of breath and wheezing.   Cardiovascular: Negative for chest pain, palpitations and leg swelling.  Gastrointestinal: Negative for heartburn, abdominal pain, diarrhea, constipation, blood in stool and melena.  Genitourinary: Negative.   Neurological: Negative for dizziness, loss of consciousness and headaches.  Psychiatric/Behavioral: Negative for depression. The patient is not nervous/anxious and does not have insomnia.       Physical Exam  BP 138/80 mmHg  Pulse 74  Temp(Src) 98.4 F (36.9 C) (Temporal)  Resp 18  Ht  (1.778 m)  Wt 189 lb (85.73 kg)  BMI 27.12 kg/m2  General Appearance: Well nourished and in no apparent distress. Eyes: PERRLA, EOMs, conjunctiva no swelling or erythema, normal fundi and vessels. Sinuses: No frontal/maxillary tenderness ENT/Mouth: EACs patent / TMs  nl. Nares clear without erythema, swelling, mucoid exudates. Oral hygiene is good. No erythema, swelling, or exudate. Tongue normal, non-obstructing. Tonsils not swollen or erythematous. Hearing normal.  Neck: Supple, thyroid normal. No bruits, nodes or JVD. Respiratory: Respiratory effort normal.  BS equal and clear bilateral without rales, rhonci, wheezing or  stridor. Cardio: Heart sounds are normal with regular rate and rhythm and no murmurs, rubs or gallops. Peripheral pulses are normal and equal bilaterally without edema. No aortic or femoral bruits. Chest: symmetric with normal excursions and percussion. Abdomen: Flat, soft, with bowl sounds. Nontender, no guarding, rebound, hernias, masses, or organomegaly.  Lymphatics: Non tender without lymphadenopathy.  Musculoskeletal: Full ROM all peripheral extremities, joint stability, 5/5 strength, and normal gait. Skin: Warm and dry without rashes, lesions, cyanosis, clubbing or  Ecchymosis.  Verucous lesions less than 0.5 mm on the index finger, pad of the index finger, and the thumb.  Non-tender to palpation.  Some crusting and cracks visible  Neuro: Cranial nerves intact, reflexes equal bilaterally. Normal muscle tone, no cerebellar symptoms. Sensation intact.  Pysch: Awake and oriented X 3, normal affect, Insight and Judgment appropriate.   Assessment and Plan    1. Routine general medical examination at a health care facility   2. Screening for diabetes mellitus - Hemoglobin A1c - Insulin, random  3. Screening for hyperlipidemia  - Lipid panel  4. Screening for thyroid disorder  - TSH  5. Screening for hematuria or proteinuria  Urinalysis, Routine w reflex microscopic (not at [facility]) - Microalbumin / creatinine urine ratio  6. Screening for deficiency anemia  - Iron and TIBC - Vitamin B12  7. Medication management  - CBC with Differential/Platelet - BASIC METABOLIC PANEL WITH GFR - Hepatic function panel - Magnesium  8. Vitamin D deficiency  - VITAMIN D 25 Hydroxy (Vit-D Deficiency, Fractures)  9. Warts -cryo done today     Continue prudent diet as discussed, weight control, regular exercise, and medications. Routine screening labs and tests as requested with regular follow-up as recommended.  Over 40 minutes of exam, counseling, chart review and critical decision  making was performed

## 2015-11-13 NOTE — Addendum Note (Signed)
Addended by: Terri PiedraFORCUCCI, Darion Milewski A on: 11/13/2015 11:41 AM   Modules accepted: Kipp BroodSmartSet

## 2015-11-14 LAB — MICROALBUMIN / CREATININE URINE RATIO
Creatinine, Urine: 203 mg/dL (ref 20–370)
Microalb Creat Ratio: 1 mcg/mg creat (ref <30)
Microalb, Ur: 0.3 mg/dL

## 2015-11-14 LAB — URINALYSIS, ROUTINE W REFLEX MICROSCOPIC
BILIRUBIN URINE: NEGATIVE
GLUCOSE, UA: NEGATIVE
Hgb urine dipstick: NEGATIVE
Ketones, ur: NEGATIVE
Leukocytes, UA: NEGATIVE
Nitrite: NEGATIVE
Protein, ur: NEGATIVE
SPECIFIC GRAVITY, URINE: 1.023 (ref 1.001–1.035)
pH: 6 (ref 5.0–8.0)

## 2015-11-14 LAB — HEMOGLOBIN A1C
HEMOGLOBIN A1C: 5.3 % (ref <5.7)
MEAN PLASMA GLUCOSE: 105 mg/dL (ref <117)

## 2015-11-14 LAB — VITAMIN D 25 HYDROXY (VIT D DEFICIENCY, FRACTURES): VIT D 25 HYDROXY: 32 ng/mL (ref 30–100)

## 2015-11-16 LAB — INSULIN, RANDOM: Insulin: 6.6 u[IU]/mL (ref 2.0–19.6)
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
## 2016-11-14 ENCOUNTER — Encounter: Payer: Self-pay | Admitting: Internal Medicine

## 2017-11-14 ENCOUNTER — Encounter: Payer: Self-pay | Admitting: Internal Medicine

## 2019-10-31 ENCOUNTER — Telehealth: Payer: Self-pay | Admitting: Orthopaedic Surgery

## 2019-10-31 NOTE — Telephone Encounter (Signed)
Please advise 
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
## 2019-10-31 NOTE — Telephone Encounter (Signed)
OK but not needed. They can see clavicle scar and will pick it up on the scanner. ucall

## 2019-10-31 NOTE — Telephone Encounter (Signed)
Please see previous msg

## 2019-11-01 NOTE — Telephone Encounter (Signed)
Note entered and placed at front desk for pick up. I left voicemail for Morrie Sheldon advising.

## 2020-10-16 ENCOUNTER — Other Ambulatory Visit: Payer: Self-pay

## 2020-10-16 ENCOUNTER — Emergency Department (HOSPITAL_COMMUNITY)
Admission: EM | Admit: 2020-10-16 | Discharge: 2020-10-16 | Disposition: A | Discharge status: Home / Self Care | Payer: Self-pay | Attending: Emergency Medicine | Admitting: Emergency Medicine

## 2020-10-16 ENCOUNTER — Emergency Department (HOSPITAL_COMMUNITY): Payer: Self-pay

## 2020-10-16 ENCOUNTER — Encounter (HOSPITAL_COMMUNITY): Payer: Self-pay | Admitting: Emergency Medicine

## 2020-10-16 DIAGNOSIS — M25512 Pain in left shoulder: Secondary | ICD-10-CM | POA: Insufficient documentation

## 2020-10-16 DIAGNOSIS — Y92410 Unspecified street and highway as the place of occurrence of the external cause: Secondary | ICD-10-CM | POA: Insufficient documentation

## 2020-10-16 DIAGNOSIS — F17210 Nicotine dependence, cigarettes, uncomplicated: Secondary | ICD-10-CM | POA: Insufficient documentation

## 2020-10-16 MED ORDER — IBUPROFEN 800 MG PO TABS
800.0000 mg | ORAL_TABLET | Freq: Once | ORAL | Status: AC
  Administered 2020-10-16: 800 mg via ORAL
  Filled 2020-10-16: qty 1
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
## 2020-10-16 NOTE — Discharge Instructions (Addendum)
Please continue to use ibuprofen as needed for pain Follow-up with your primary care doctor or orthopedist if pain continues. No evidence of acute abnormality was seen on your x-rays today.

## 2020-10-22 ENCOUNTER — Ambulatory Visit: Payer: Self-pay

## 2020-10-22 ENCOUNTER — Ambulatory Visit: Payer: Self-pay | Admitting: Surgery

## 2020-10-22 ENCOUNTER — Encounter: Payer: Self-pay | Admitting: Surgery

## 2020-10-22 ENCOUNTER — Other Ambulatory Visit: Payer: Self-pay

## 2020-10-22 VITALS — BP 113/82 | HR 78 | Ht 70.0 in | Wt 189.0 lb

## 2020-10-22 DIAGNOSIS — M25512 Pain in left shoulder: Secondary | ICD-10-CM

## 2020-10-22 DIAGNOSIS — S46912A Strain of unspecified muscle, fascia and tendon at shoulder and upper arm level, left arm, initial encounter: Secondary | ICD-10-CM
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
## 2020-10-28 ENCOUNTER — Encounter: Payer: Self-pay | Admitting: Surgery

## 2020-10-28 ENCOUNTER — Ambulatory Visit (INDEPENDENT_AMBULATORY_CARE_PROVIDER_SITE_OTHER): Payer: Self-pay | Admitting: Surgery

## 2020-10-28 ENCOUNTER — Other Ambulatory Visit: Payer: Self-pay

## 2020-10-28 VITALS — BP 117/79 | HR 60 | Ht 70.0 in | Wt 189.0 lb

## 2020-10-28 DIAGNOSIS — S46912A Strain of unspecified muscle, fascia and tendon at shoulder and upper arm level, left arm, initial encounter: Secondary | ICD-10-CM
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
## 2020-11-12 ENCOUNTER — Ambulatory Visit: Payer: Self-pay | Admitting: Surgery

## 2022-03-20 ENCOUNTER — Other Ambulatory Visit: Payer: Self-pay

## 2022-03-20 ENCOUNTER — Emergency Department (HOSPITAL_COMMUNITY)
Admission: EM | Admit: 2022-03-20 | Discharge: 2022-03-20 | Disposition: A | Discharge status: Home / Self Care | Payer: Self-pay | Attending: Emergency Medicine | Admitting: Emergency Medicine

## 2022-03-20 ENCOUNTER — Emergency Department (HOSPITAL_COMMUNITY): Payer: Self-pay

## 2022-03-20 ENCOUNTER — Encounter (HOSPITAL_COMMUNITY): Payer: Self-pay | Admitting: Emergency Medicine

## 2022-03-20 DIAGNOSIS — Z5321 Procedure and treatment not carried out due to patient leaving prior to being seen by health care provider: Secondary | ICD-10-CM | POA: Insufficient documentation

## 2022-03-20 DIAGNOSIS — R0781 Pleurodynia: Secondary | ICD-10-CM | POA: Insufficient documentation

## 2022-03-20 DIAGNOSIS — R0602 Shortness of breath: Secondary | ICD-10-CM | POA: Insufficient documentation

## 2022-03-20 DIAGNOSIS — F419 Anxiety disorder, unspecified: Secondary | ICD-10-CM | POA: Insufficient documentation
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
## 2022-03-20 NOTE — ED Notes (Signed)
Na for bed assignment.
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
## 2024-04-03 ENCOUNTER — Ambulatory Visit: Payer: Self-pay

## 2024-04-03 NOTE — Telephone Encounter (Signed)
 FYI Only or Action Required?: FYI only for provider.  Patient was last seen in primary care on not established.  Called Nurse Triage reporting Ear Pain.  Symptoms began a week ago.  Interventions attempted: OTC medications: swimmers ear drops, sinus medication  and Prescription medications: ibuprophen.  Symptoms are: gradually worsening.  Triage Disposition: See Physician Within 24 Hours  Patient/caregiver understands and will follow disposition?: Yes      UC or f/u with his pcp    Copied from CRM #[id number]. Topic: Clinical - Red Word Triage >> Apr 03, 2024  2:38 PM Tiffini S wrote: Red Word that prompted transfer to Nurse Triage: Patient is having ear pain/ throbbing, congestion- have use ear drops that are not working Reason for Disposition  Earache  (Exceptions: Brief ear pain of lasting less than 60 minutes, or earache occurring during air travel.)  Answer Assessment - Initial Assessment Questions 1. LOCATION: Which ear is involved?     Right ear 2. ONSET: When did the ear pain start?      A week 3. SEVERITY: How bad is the pain?  (Scale 1-10; mild, moderate or severe)     Feels like a lighter being held up to it 7/10 4. URI SYMPTOMS: Do you have a runny nose or cough?     no 5. FEVER: Do you have a fever? If Yes, ask: What is your temperature, how was it measured, and when did it start?     no 6. CAUSE: Have you been swimming recently?, How often do you use Q-TIPS?, Have you had any recent air travel or scuba diving?     no 7. OTHER SYMPTOMS: Do you have any other symptoms? (e.g., decreased hearing, dizziness, headache, stiff neck, vomiting)     no  Protocols used: Earache-A-AH
